# Patient Record
Sex: Male | Born: 1995 | Race: White | Hispanic: No | Marital: Single | Smoking: Never smoker
Health system: Southern US, Community
[De-identification: ages and names within clinical notes are randomized; demographics above are authoritative.]

## PROBLEM LIST (undated history)

## (undated) DIAGNOSIS — F909 Attention-deficit hyperactivity disorder, unspecified type: Secondary | ICD-10-CM

## (undated) HISTORY — DX: Attention-deficit hyperactivity disorder, unspecified type: F90.9

---

## 2018-01-28 ENCOUNTER — Observation Stay (HOSPITAL_COMMUNITY): Payer: BLUE CROSS/BLUE SHIELD

## 2018-01-28 ENCOUNTER — Encounter (HOSPITAL_COMMUNITY): Payer: Self-pay | Admitting: Internal Medicine

## 2018-01-28 ENCOUNTER — Observation Stay (HOSPITAL_COMMUNITY)
Admission: EM | Admit: 2018-01-28 | Discharge: 2018-01-29 | Disposition: A | Discharge status: Home / Self Care | Payer: BLUE CROSS/BLUE SHIELD | Attending: Emergency Medicine | Admitting: Emergency Medicine

## 2018-01-28 ENCOUNTER — Emergency Department (HOSPITAL_COMMUNITY): Payer: BLUE CROSS/BLUE SHIELD

## 2018-01-28 DIAGNOSIS — G40909 Epilepsy, unspecified, not intractable, without status epilepticus: Secondary | ICD-10-CM

## 2018-01-28 DIAGNOSIS — J329 Chronic sinusitis, unspecified: Secondary | ICD-10-CM

## 2018-01-28 DIAGNOSIS — J019 Acute sinusitis, unspecified: Secondary | ICD-10-CM | POA: Diagnosis not present

## 2018-01-28 DIAGNOSIS — R569 Unspecified convulsions: Secondary | ICD-10-CM | POA: Diagnosis not present

## 2018-01-28 LAB — COMPREHENSIVE METABOLIC PANEL
ALT: 49 U/L — AB (ref 0–44)
AST: 30 U/L (ref 15–41)
Albumin: 4.5 g/dL (ref 3.5–5.0)
Alkaline Phosphatase: 82 U/L (ref 38–126)
Anion gap: 10 (ref 5–15)
BUN: 14 mg/dL (ref 6–20)
CHLORIDE: 103 mmol/L (ref 98–111)
CO2: 24 mmol/L (ref 22–32)
CREATININE: 0.82 mg/dL (ref 0.61–1.24)
Calcium: 9.5 mg/dL (ref 8.9–10.3)
GFR calc non Af Amer: >60 mL/min (ref >60)
Glucose, Bld: 93 mg/dL (ref 70–99)
Potassium: 4.1 mmol/L (ref 3.5–5.1)
Sodium: 137 mmol/L (ref 135–145)
Total Bilirubin: 1.1 mg/dL (ref 0.3–1.2)
Total Protein: 6.9 g/dL (ref 6.5–8.1)

## 2018-01-28 LAB — RAPID URINE DRUG SCREEN, HOSP PERFORMED
Amphetamines: NOT DETECTED
Barbiturates: NOT DETECTED
Benzodiazepines: NOT DETECTED
Cocaine: NOT DETECTED
Opiates: NOT DETECTED
Tetrahydrocannabinol: NOT DETECTED

## 2018-01-28 LAB — CBC WITH DIFFERENTIAL/PLATELET
Abs Immature Granulocytes: 0.03 K/uL (ref 0.00–0.07)
Basophils Absolute: 0 K/uL (ref 0.0–0.1)
Basophils Relative: 0 %
Eosinophils Absolute: 0.1 K/uL (ref 0.0–0.5)
Eosinophils Relative: 1 %
HCT: 45.8 % (ref 39.0–52.0)
Hemoglobin: 15.1 g/dL (ref 13.0–17.0)
Immature Granulocytes: 0 %
Lymphocytes Relative: 16 %
Lymphs Abs: 1.5 K/uL (ref 0.7–4.0)
MCH: 29.3 pg (ref 26.0–34.0)
MCHC: 33 g/dL (ref 30.0–36.0)
MCV: 88.8 fL (ref 80.0–100.0)
Monocytes Absolute: 0.7 K/uL (ref 0.1–1.0)
Monocytes Relative: 8 %
Neutro Abs: 7 K/uL (ref 1.7–7.7)
Neutrophils Relative %: 75 %
Platelets: 287 K/uL (ref 150–400)
RBC: 5.16 MIL/uL (ref 4.22–5.81)
RDW: 11.7 % (ref 11.5–15.5)
WBC: 9.4 K/uL (ref 4.0–10.5)
nRBC: 0 % (ref 0.0–0.2)

## 2018-01-28 LAB — ETHANOL: Alcohol, Ethyl (B): <10 mg/dL

## 2018-01-28 LAB — URINALYSIS, ROUTINE W REFLEX MICROSCOPIC
Bilirubin Urine: NEGATIVE
Glucose, UA: NEGATIVE mg/dL
Hgb urine dipstick: NEGATIVE
Ketones, ur: NEGATIVE mg/dL
Leukocytes, UA: NEGATIVE
NITRITE: NEGATIVE
Protein, ur: NEGATIVE mg/dL
Specific Gravity, Urine: 1.015 (ref 1.005–1.030)
pH: 7 (ref 5.0–8.0)

## 2018-01-28 LAB — MAGNESIUM: Magnesium: 2.4 mg/dL (ref 1.7–2.4)

## 2018-01-28 LAB — TROPONIN I: Troponin I: <0.03 ng/mL (ref <0.03)

## 2018-01-28 MED ORDER — LEVETIRACETAM 500 MG PO TABS: 500.0000 mg | ORAL_TABLET | Freq: Two times a day (BID) | ORAL | 0 refills | Status: DC

## 2018-01-28 MED ORDER — LORAZEPAM 2 MG/ML IJ SOLN
2.0000 mg | Freq: Once | INTRAMUSCULAR | Status: AC
  Administered 2018-01-28: 1 mg via INTRAVENOUS
  Filled 2018-01-28: qty 1

## 2018-01-28 MED ORDER — SODIUM CHLORIDE 0.9 % IV SOLN
INTRAVENOUS | Status: AC
  Administered 2018-01-28 – 2018-01-29 (×2): via INTRAVENOUS

## 2018-01-28 MED ORDER — LORAZEPAM 2 MG/ML IJ SOLN: 2.0000 mg | INTRAMUSCULAR | Status: DC | PRN

## 2018-01-28 MED ORDER — HALOPERIDOL LACTATE 5 MG/ML IJ SOLN: 5.0000 mg | Freq: Once | INTRAMUSCULAR | Status: DC

## 2018-01-28 MED ORDER — LORAZEPAM 2 MG/ML IJ SOLN
0.5000 mg | Freq: Once | INTRAMUSCULAR | Status: AC
  Administered 2018-01-28: 0.5 mg via INTRAVENOUS

## 2018-01-28 MED ORDER — LEVETIRACETAM IN NACL 1000 MG/100ML IV SOLN
1000.0000 mg | Freq: Once | INTRAVENOUS | Status: AC
  Administered 2018-01-28: 1000 mg via INTRAVENOUS
  Filled 2018-01-28: qty 100

## 2018-01-28 MED ORDER — LEVETIRACETAM IN NACL 1000 MG/100ML IV SOLN
1000.0000 mg | Freq: Two times a day (BID) | INTRAVENOUS | Status: DC
  Administered 2018-01-29 (×2): 1000 mg via INTRAVENOUS
  Filled 2018-01-28 (×2): qty 100

## 2018-01-28 MED ORDER — LORAZEPAM 2 MG/ML IJ SOLN: 4.0000 mg | INTRAMUSCULAR | Status: AC

## 2018-01-28 MED ORDER — ENOXAPARIN SODIUM 40 MG/0.4ML ~~LOC~~ SOLN
40.0000 mg | Freq: Every day | SUBCUTANEOUS | Status: DC
  Administered 2018-01-28: 40 mg via SUBCUTANEOUS
  Filled 2018-01-28: qty 0.4

## 2018-01-28 MED ORDER — ACETAMINOPHEN 325 MG PO TABS
650.0000 mg | ORAL_TABLET | Freq: Four times a day (QID) | ORAL | Status: DC | PRN
  Administered 2018-01-28: 650 mg via ORAL
  Filled 2018-01-28: qty 2

## 2018-01-28 NOTE — ED Provider Notes (Signed)
Medical screening examination/treatment/procedure(s) were conducted as a shared visit with non-physician practitioner(s) and myself.  I personally evaluated the patient during the encounter. Briefly, the patient is a 23 y.o. male who presents with seizures.  Patient is a Lexicographer.  Supposedly had seizure about 8 months ago and had an EEG with neurology that was unremarkable, he is not on any antiseizure medications.  Patient had seizure-like activity today.  Has had post ictal phase upon arrival.  Patient had about 1 minute of a grand mal seizure that was tonic-clonic that was witnessed by roommates.  He did bite his tongue.  Patient denies any recent drug or alcohol use.  Patient neurologically intact.  Patient discussed with neurology and given likely second seizure will start him on Keppra.  We will have him follow-up outpatient.  However, prior to patient being discharged he had a second grand mal seizure lasting about 1 minute.  He was given IV 1 mg of Ativan.  At this time will admit for long-term monitoring, MRI.  Will give IV Keppra load.   Thus far lab work is unremarkable.  No fever, no concern for meningitis.  Patient did return to his baseline before second seizure.  No signs to suggest infection.  Electrolytes within normal limits.  No urinary tract infection.  Urine drug screen unremarkable.  Alcohol level negative.  Patient to be admitted to medicine service for further care.Hemodynamically improved after seizure-like activity.   This chart was dictated using voice recognition software.  Despite best efforts to proofread,  errors can occur which can change the documentation meaning.  .Critical Care Performed by: Virgina Norfolk, DO Authorized by: Virgina Norfolk, DO   Critical care provider statement:    Critical care time (minutes):  35   Critical care time was exclusive of:  Separately billable procedures and treating other patients and teaching time   Critical care was  necessary to treat or prevent imminent or life-threatening deterioration of the following conditions:  CNS failure or compromise   Critical care was time spent personally by me on the following activities:  Development of treatment plan with patient or surrogate, evaluation of patient's response to treatment, ordering and review of laboratory studies, ordering and performing treatments and interventions, ordering and review of radiographic studies, pulse oximetry and re-evaluation of patient's condition     EKG Interpretation None           Virgina Norfolk, DO 01/28/18 1920

## 2018-01-28 NOTE — ED Notes (Signed)
Pt continued to be agitated after initial 1mg  or ativan. VO to give .5mg .  Wasted remainder .5mg  .12ml of ativan with Christeen Douglas, RN.

## 2018-01-28 NOTE — ED Notes (Signed)
Patient transported to X-ray 

## 2018-01-28 NOTE — H&P (Signed)
History and Physical    Mondrell Anglade IFB:[id number] DOB: 09-09-1995 DOA: 01/28/2018  PCP: System, Pcp Not In Patient coming from: Home  Chief Complaint: Seizure  HPI: Georgie Shambach is a 23 y.o. male with no significant past medical history presenting to the ED via EMS after he was noted to have a tonic-clonic seizure by her roommates at home.  Patient postictal with tongue injury present upon arrival to the ED.  ED provider discussed the case with neurology and plan was to discharge the patient home on Keppra.  However, before being discharged patient had a second grand mal seizure lasting about 1 minute in the ED.  He was given IV 1 mg Ativan.  IV Keppra load given.  Patient is currently postictal and very somnolent.  No history could be obtained from him.  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  He had a seizure about 8 months ago and had an EEG with neurology in a different state that was unremarkable.  He is not on any home antiepileptics.  Review of Systems: As per HPI otherwise 10 point review of systems negative.  History reviewed. No pertinent past medical history.   History reviewed. No pertinent surgical history.   has no history on file for tobacco, alcohol, and drug.  No Known Allergies  History reviewed. No pertinent family history.  Prior to Admission medications   Medication Sig Start Date End Date Taking? Authorizing Provider  levETIRAcetam (KEPPRA) 500 MG tablet Take 1 tablet (500 mg total) by mouth 2 (two) times daily for 15 days. 01/28/18 02/12/18  Claude Manges, PA-C    Physical Exam: Vitals:   01/28/18 1715 01/28/18 1730 01/28/18 1745 01/28/18 1830  BP: 108/78 91/63 (!) 145/83 100/62  Pulse: 72 76 78 93  Resp: 19 12 18 15   Temp:      TempSrc:      SpO2: 100% 100% 100% 97%    Physical Exam  Constitutional: He appears well-developed and well-nourished. No distress.  HENT:  Head: Normocephalic.  Tongue bite marks  present  Eyes:  Unable to assess due to lack of patient cooperation  Neck: Neck supple.  Cardiovascular: Normal rate, regular rhythm and intact distal pulses.  Pulmonary/Chest: Effort normal and breath sounds normal. No respiratory distress. He has no wheezes. He has no rales.  Abdominal: Soft. Bowel sounds are normal. He exhibits no distension. There is no abdominal tenderness.  Musculoskeletal:        General: No edema.  Neurological:  Postictal, very somnolent Following commands only intermittently  Skin: Skin is warm and dry. He is not diaphoretic.     Labs on Admission: I have personally reviewed following labs and imaging studies  CBC: Recent Labs  Lab 01/28/18 1419  WBC 9.4  NEUTROABS 7.0  HGB 15.1  HCT 45.8  MCV 88.8  PLT 287   Basic Metabolic Panel: Recent Labs  Lab 01/28/18 1419 01/28/18 1711  NA 137  --   K 4.1  --   CL 103  --   CO2 24  --   GLUCOSE 93  --   BUN 14  --   CREATININE 0.82  --   CALCIUM 9.5  --   MG  --  2.4   GFR: CrCl cannot be calculated (Unknown ideal weight.). Liver Function Tests: Recent Labs  Lab 01/28/18 1419  AST 30  ALT 49*  ALKPHOS 82  BILITOT 1.1  PROT 6.9  ALBUMIN 4.5  No results for input(s): LIPASE, AMYLASE in the last 168 hours. No results for input(s): AMMONIA in the last 168 hours. Coagulation Profile: No results for input(s): INR, PROTIME in the last 168 hours. Cardiac Enzymes: Recent Labs  Lab 01/28/18 1419  TROPONINI <0.03   BNP (last 3 results) No results for input(s): PROBNP in the last 8760 hours. HbA1C: No results for input(s): HGBA1C in the last 72 hours. CBG: No results for input(s): GLUCAP in the last 168 hours. Lipid Profile: No results for input(s): CHOL, HDL, LDLCALC, TRIG, CHOLHDL, LDLDIRECT in the last 72 hours. Thyroid Function Tests: No results for input(s): TSH, T4TOTAL, FREET4, T3FREE, THYROIDAB in the last 72 hours. Anemia Panel: No results for input(s): VITAMINB12, FOLATE,  FERRITIN, TIBC, IRON, RETICCTPCT in the last 72 hours. Urine analysis:    Component Value Date/Time   COLORURINE YELLOW 01/28/2018 1620   APPEARANCEUR CLEAR 01/28/2018 1620   LABSPEC 1.015 01/28/2018 1620   PHURINE 7.0 01/28/2018 1620   GLUCOSEU NEGATIVE 01/28/2018 1620   HGBUR NEGATIVE 01/28/2018 1620   BILIRUBINUR NEGATIVE 01/28/2018 1620   KETONESUR NEGATIVE 01/28/2018 1620   PROTEINUR NEGATIVE 01/28/2018 1620   NITRITE NEGATIVE 01/28/2018 1620   LEUKOCYTESUR NEGATIVE 01/28/2018 1620    Radiological Exams on Admission: Ct Head Wo Contrast  Result Date: 01/28/2018 CLINICAL DATA:  Seizure today. EXAM: CT HEAD WITHOUT CONTRAST TECHNIQUE: Contiguous axial images were obtained from the base of the skull through the vertex without intravenous contrast. COMPARISON:  None. FINDINGS: Brain: No evidence of acute infarction, hemorrhage, hydrocephalus, extra-axial collection or mass lesion/mass effect. Vascular: No hyperdense vessel or unexpected calcification. Skull: Intact.  No focal lesion. Sinuses/Orbits: The left frontal sinus is completely opacified. Scattered ethmoid air cell disease is seen. Mucous retention cysts or polyps are noted in the maxillary sinuses, greater on the left. Orbits appear normal. Other: None. IMPRESSION: No acute abnormality. Complete opacification of the left frontal sinus with scattered ethmoid air cell disease and mucous retention cysts or polyps in the maxillary sinuses, greater on the left. Electronically Signed   By: Drusilla Kannerhomas  Dalessio M.D.   On: 01/28/2018 15:30    EKG: Independently reviewed.  Sinus rhythm (heart rate 60).  Assessment/Plan Principal Problem:   Seizures (HCC) Active Problems:   Sinusitis   Seizures Patient had a witnessed tonic-clonic seizure at home and another episode in the ED.  Received Ativan in the ED. Unable to obtain any history from the patient at this time as he is postictal and very somnolent. Per ED provider conversation with the  patient, he had seizure activity about 8 months ago and was seen by neurology and [place]   Infectious etiology less likely to be a precipitating factor as patient is afebrile and does not have leukocytosis.  UA not suggestive of infection.  Magnesium level normal.  Blood ethanol level negative.  UDS negative. Head CT negative for acute abnormality.  Patient was seen by Dr. Otelia LimesLindzen from neurology. -Monitor on telemetry -Received IV Keppra load in the ED.  Continue IV Keppra 1000 mg every 12 hours. -Ativan PRN -EEG -MRI brain pending -Seizure precautions -Keep n.p.o.; IV fluid hydration -Neurology following; appreciate recs  Sinusitis Head CT showing complete opacification of the left frontal sinus with scattered ethmoid air cell disease and mucous retention cysts or polyps in the maxillary sinuses, greater on the left.  Unable to obtain any history from the patient at this time.  He is afebrile and does not have  leukocytosis.  Possibly chronic sinusitis. -Please discuss with the patient in the morning.  DVT prophylaxis: Lovenox Code Status: Full code Family Communication: No family available Disposition Plan: Anticipate discharge in 1 to 2 days. Consults called: Neurology Admission status: Observation, telemetry  John GiovanniVasundhra Chasitee Zenker MD [facility] Pager [phone]  If 7PM-7AM, please contact night-coverage [email or web address] Password [email or web address]  01/28/2018, 7:44 PM

## 2018-01-28 NOTE — ED Notes (Signed)
EMT applied sz pads to bed for pt safety.

## 2018-01-28 NOTE — Discharge Instructions (Addendum)
I have prescribed medication, please take 1 tablet twice a day to prevent seizures. You are really not allowed to drive until you are seizure-free for 6 months.  You also need to see a neurologist, please schedule an appointment with [facility] for an EEG along with an MRI brain.  I have provided this referral on your discharge papers.

## 2018-01-28 NOTE — ED Notes (Signed)
Pt was taken from xray to MRI. Called [facility] and left msg for accepting nurse who took report earlier. Pt will go up when they return from MRI.

## 2018-01-28 NOTE — ED Notes (Signed)
Patient returned from MRI.

## 2018-01-28 NOTE — Consult Note (Signed)
NEURO HOSPITALIST CONSULT NOTE   Requestig physician: Dr. Loney Lohathore  Reason for Consult: Recurrence of seizure  History obtained from:  Chart    HPI:                                                                                                                                          Michael Haley is an 23 y.o. male who presented to the ED this afternoon after having a GTC seizure lasting 1 minute this morning. The seizure was witnessed by [facility]mates [facility]. The patient was postictal with tongue injury present upon arrival to ED. He did not have urine or bowel incontinence. He was alert and oriented x 4 on arrival to the ED. He denied recent EtOH and drug use.  The patient [facility]. He was not placed on an anticonvulsant at that time and did not have a Neurology follow up.   After ED assessment, he was about to be loaded with Keppra when he had a second seizure. Decision was made to admit for further monitoring, EEG and MRI.   He is unable to provide ROS to Neurologist due to postictal state. Per initial EDP note he had been feeling very fatigued recently. He denied CP, sick contacts or headache.   History reviewed. No pertinent past medical history.  History reviewed. No pertinent surgical history.  History reviewed. No pertinent family history.            Social History:  has no history on file for tobacco, alcohol, and drug.  No Known Allergies  HOME MEDICATIONS:                                                                                                                     None   ROS:  Unable to obtain due to postictal state, except for complaint of back pain.    Blood pressure 107/77, pulse 80, temperature 98.2 F (36.8 C), temperature source  Oral, resp. rate 15, SpO2 95 %.   General Examination:                                                                                                       Physical Exam  HEENT-  Normocephalic. Evidence for bite injury to anterior tongue.     Lungs- Respirations unlabored.  Extremities- Warm and well perfused Musculoskeletal- No TTP to spinous processes or paraspinal muscles   Neurological Examination Mental Status: Postical somnolence. Oriented to hospital. Responds to name. Follows motor commands intermittently. No neglect.  Cranial Nerves: II: Not cooperative to visual field testing. PERRL.   III,IV, VI: Eyelids appear symmetric. Eyes conjugate when eyelids are opened transiently.  V,VII: Face symmetric. Unable to participate in sensory testing.  VIII: hearing intact to voice IX,X: No hypophonia XI: No asymmetry XII: midline tongue extension Motor: Moves all 4 extremities equally to command without asymmetry. Maximum resistance in context of postictal state is 4/5.  Sensory: Moves extremities to tactile stimulation.   Deep Tendon Reflexes: 2+ throughout except for 3+ patellae.  Plantars: Right: downgoing   Left: downgoing Cerebellar: Performs FNF with no gross ataxia bilaterally Gait: Unable to assess   Lab Results: Basic Metabolic Panel: Recent Labs  Lab 01/28/18 1419 01/28/18 1711  NA 137  --   K 4.1  --   CL 103  --   CO2 24  --   GLUCOSE 93  --   BUN 14  --   CREATININE 0.82  --   CALCIUM 9.5  --   MG  --  2.4    CBC: Recent Labs  Lab 01/28/18 1419  WBC 9.4  NEUTROABS 7.0  HGB 15.1  HCT 45.8  MCV 88.8  PLT 287    Cardiac Enzymes: Recent Labs  Lab 01/28/18 1419  TROPONINI <0.03    Lipid Panel: No results for input(s): CHOL, TRIG, HDL, CHOLHDL, VLDL, LDLCALC in the last 168 hours.  Imaging: Ct Head Wo Contrast  Result Date: 01/28/2018 CLINICAL DATA:  Seizure today. EXAM: CT HEAD WITHOUT CONTRAST TECHNIQUE: Contiguous axial images were  obtained from the base of the skull through the vertex without intravenous contrast. COMPARISON:  None. FINDINGS: Brain: No evidence of acute infarction, hemorrhage, hydrocephalus, extra-axial collection or mass lesion/mass effect. Vascular: No hyperdense vessel or unexpected calcification. Skull: Intact.  No focal lesion. Sinuses/Orbits: The left frontal sinus is completely opacified. Scattered ethmoid air cell disease is seen. Mucous retention cysts or polyps are noted in the maxillary sinuses, greater on the left. Orbits appear normal. Other: None. IMPRESSION: No acute abnormality. Complete opacification of the left frontal sinus with scattered ethmoid air cell disease and mucous retention cysts or polyps in the maxillary sinuses, greater on the left. Electronically Signed   By: Drusilla Kanner M.D.   On: 01/28/2018 15:30    Assessment: 23 year old male presenting with second and third seizures  of his lifetime 1. Postictal on exam with no lateralizing features 2. CT head shows no acute abnormality   Recommendations: 1. Loaded with Keppra 1000 mg IV. Continue with 500 mg IV or PO BID 2. EEG 3. MRI brain with contrast.     Electronically signed: Dr. Caryl Pina 01/28/2018, 9:04 PM

## 2018-01-28 NOTE — ED Triage Notes (Signed)
Pt in via [facility], per EMS pt had witnessed 1 minute seizure grand mal tonic clonic by roommates, pt postictal with tongue injury present upon arrival to ED, pt did not have urine or bowel incontinence, pt A&O x4, denies recent ETOH and Drug use, recent flight from [place] last  Night

## 2018-01-28 NOTE — ED Notes (Signed)
MRI called to inquire about pt. Pt was resting and not currently a good historian about his prior med hx. MRI tech suggested a cxr to confirm there is nothing mechanical that would prevent him having an MRI safely. EDP Mardene Celeste informed and she will order a cxr.

## 2018-01-28 NOTE — ED Provider Notes (Signed)
[facility] EMERGENCY DEPARTMENT Provider Note   CSN: [id number] Arrival date & time: 01/28/18  1310     History   Chief Complaint Chief Complaint  Patient presents with  . Seizures    HPI Michael Haley is a 23 y.o. male.  23 y.o male with no PMH presents to the ED s/p tonic-clonic seizure at [facility] this morning. Seizure was witnessed by his roommates and lasted approximately 1 minute. Patient reports having a similar episode in May where he was seen in the ED at [facility], he reports not being placed on any seizure medication and has not have follow up with neurology. Patient return from [place] last night on an hour flight, and reports arriving home at 1am this morning. He endorses LOC and feeling very fatigued lately. He denies any chest pain, sick contacts, headache or other complaints. History was very limited due to patient's state.        Home Medications    Prior to Admission medications   Medication Sig Start Date End Date Taking? Authorizing Provider  levETIRAcetam (KEPPRA) 500 MG tablet Take 1 tablet (500 mg total) by mouth 2 (two) times daily for 15 days. 01/28/18 02/12/18  Claude Manges, PA-C    Family History No family history on file.  Social History Social History   Tobacco Use  . Smoking status: Not on file  Substance Use Topics  . Alcohol use: Not on file  . Drug use: Not on file     Allergies   Patient has no known allergies.   Review of Systems Review of Systems  Unable to perform ROS: Acuity of condition  Constitutional: Negative for fever.     Physical Exam Updated Vital Signs BP 100/62   Pulse 93   Temp 98.2 F (36.8 C) (Oral)   Resp 15   SpO2 97%   Physical Exam Vitals signs and nursing note reviewed.  Constitutional:      Appearance: He is well-developed.     Comments: Sleepy and fatigued.   HENT:     Head: Normocephalic and atraumatic.     Mouth/Throat:     Tongue:  Lesions present.     Comments: Abrasion to the left side of his tongue. bleeding Controlled at this time. Eyes:     General: No scleral icterus.    Pupils: Pupils are equal, round, and reactive to light.  Neck:     Musculoskeletal: Normal range of motion.  Cardiovascular:     Heart sounds: Normal heart sounds.  Pulmonary:     Effort: Pulmonary effort is normal.     Breath sounds: Normal breath sounds. No wheezing.  Chest:     Chest wall: No tenderness.  Abdominal:     General: Bowel sounds are normal. There is no distension.     Palpations: Abdomen is soft.     Tenderness: There is no abdominal tenderness.  Musculoskeletal:        General: No tenderness or deformity.  Skin:    General: Skin is warm and dry.  Neurological:     Mental Status: He is alert and oriented to person, place, and time.     Comments: Alert, oriented, thought content appropriate. Speech fluent without evidence of aphasia. Able to follow 2 step commands with some delay in response.  Cranial Nerves:  II:  Peripheral visual fields grossly normal, pupils, round, reactive to light III,IV, VI: ptosis not present, extra-ocular motions intact bilaterally  V,VII: smile symmetric,  facial light touch sensation equal VIII: hearing grossly normal bilaterally  IX,X: midline uvula rise  XI: bilateral shoulder shrug equal and strong XII: midline tongue extension  Motor:  5/5 in upper and lower extremities bilaterally including strong and equal grip strength and dorsiflexion/plantar flexion Sensory: light touch normal in all extremities.  Cerebellar: normal finger-to-nose with bilateral upper extremities, pronator drift negative        ED Treatments / Results  Labs (all labs ordered are listed, but only abnormal results are displayed) Labs Reviewed  COMPREHENSIVE METABOLIC PANEL - Abnormal; Notable for the following components:      Result Value   ALT 49 (*)    All other components within normal limits  CBC  WITH DIFFERENTIAL/PLATELET  TROPONIN I  RAPID URINE DRUG SCREEN, HOSP PERFORMED  URINALYSIS, ROUTINE W REFLEX MICROSCOPIC  ETHANOL  MAGNESIUM    EKG EKG Interpretation  Date/Time:  Monday January 28 2018 13:29:07 EST Ventricular Rate:  60 PR Interval:    QRS Duration: 105 QT Interval:  402 QTC Calculation: 402 R Axis:   90 Text Interpretation:  Sinus rhythm Borderline right axis deviation Confirmed by Virgina NorfolkAdam, [place] [id number]) on 01/28/2018 5:56:18 PM   Radiology Ct Head Wo Contrast  Result Date: 01/28/2018 CLINICAL DATA:  Seizure today. EXAM: CT HEAD WITHOUT CONTRAST TECHNIQUE: Contiguous axial images were obtained from the base of the skull through the vertex without intravenous contrast. COMPARISON:  None. FINDINGS: Brain: No evidence of acute infarction, hemorrhage, hydrocephalus, extra-axial collection or mass lesion/mass effect. Vascular: No hyperdense vessel or unexpected calcification. Skull: Intact.  No focal lesion. Sinuses/Orbits: The left frontal sinus is completely opacified. Scattered ethmoid air cell disease is seen. Mucous retention cysts or polyps are noted in the maxillary sinuses, greater on the left. Orbits appear normal. Other: None. IMPRESSION: No acute abnormality. Complete opacification of the left frontal sinus with scattered ethmoid air cell disease and mucous retention cysts or polyps in the maxillary sinuses, greater on the left. Electronically Signed   By: Drusilla Kannerhomas  Dalessio M.D.   On: 01/28/2018 15:30    Procedures Procedures (including critical care time)  Medications Ordered in ED Medications  haloperidol lactate (HALDOL) injection 5 mg (has no administration in time range)  levETIRAcetam (KEPPRA) IVPB 1000 mg/100 mL premix (0 mg Intravenous Stopped 01/28/18 1824)  LORazepam (ATIVAN) injection 2 mg (1 mg Intravenous Given 01/28/18 1755)  LORazepam (ATIVAN) injection 0.5 mg (0.5 mg Intravenous Given 01/28/18 1820)     Initial Impression / Assessment and  Plan / ED Course  I have reviewed the triage vital signs and the nursing notes.  Pertinent labs & imaging results that were available during my care of the patient were reviewed by me and considered in my medical decision making (see chart for details).    Patient with no previous notes history of seizures, with a similar episode in May while visiting family was [facility].  He reports not being seen by a neurologist and has not been placed on any medication for his seizures.  CBC showed no leukocytosis, hemoglobin stable.  CMP showed no electrolyte abnormality, slight elevation on ALT.  Troponin was negative, ethanol level was less than 10. CT Head w/o contrast showed no acute abnormality. ------------------------------------------------------------------- 4:16 PM Patient reasessed by me, he reports having testing and evaluation done by neurology in [place] in October. He reports seen a neurologist but did not have any medication prescribed.  -------------------------------------------------------------------------------- 4:53 PM Spoke to Mother reports patient had an  [facility] in the month of October, he was told by the neurologist that he did not need to be placed on medication at this time as he only had one seizure.  They were advised to follow-up as needed.  Reports she is very concerned as patient currently lives here and attends [employer] here.  Call will be placed to neurology for their recommendations.  5:09 PM Spoke to Dr. Otelia LimesLindzen who is for patient to receive 1000 mg Keppra loading dose, he also needs to be sent home on Keppra medications 500 mg twice daily.  Patient needs to follow-up with neurology for a repeat EEG along with an MRI.  So advised to check on electrolytes such as calcium, magnesium and sodium.  Patient is not to drive for the next 6 months until he is seizure-free, he is also advised not to perform any  activities which would cause him harm or others harm such as swimming alone, operating heavy machinery alone, others.  5:44 PM I have spoken to mother [place] at [phone], she reports she will be taking her son to the neurologist in the upcoming week as she will be driving down from [place].  We will go home on Keppra medication is advised to follow-up with neurology at this time.  He has been resting comfortably in bed eating with a steady gait, stable for discharge at this time.  5:50 PM Patient had another episode of seizure witnessed by medical staff and myself.  At this time will place call for Dr. Otelia LimesLindzen for his recommendations and further admission. He has not received IV Keppra at this time episode lasted about 1 minute, seizure stopped will give ativan.   5:54 PM Spoke to Dr. Otelia LimesLindzen who will see patient in the ED at this time.  Will placed call to hospitalist for admission.   6:58 PM was evaluated by Dr. Otelia LimesLindzen, will order MRI Brain and admit to hospitalist service.  7:19 PM Spoke to hospitalist who will admit patient to the hospitalist service.   Final Clinical Impressions(s) / ED Diagnoses   Final diagnoses:  Seizure disorder [facility](HCC)    ED Discharge Orders         Ordered    levETIRAcetam (KEPPRA) 500 MG tablet  2 times daily     01/28/18 1743           Claude MangesSoto, Shynia Daleo, PA-C 01/28/18 1919    Loren RacerYelverton, David, MD 01/29/18 [id number]

## 2018-01-29 ENCOUNTER — Telehealth: Payer: Self-pay | Admitting: Neurology

## 2018-01-29 ENCOUNTER — Observation Stay (HOSPITAL_COMMUNITY): Payer: BLUE CROSS/BLUE SHIELD

## 2018-01-29 DIAGNOSIS — J329 Chronic sinusitis, unspecified: Secondary | ICD-10-CM | POA: Diagnosis not present

## 2018-01-29 DIAGNOSIS — R569 Unspecified convulsions: Secondary | ICD-10-CM | POA: Diagnosis not present

## 2018-01-29 LAB — HIV ANTIBODY (ROUTINE TESTING W REFLEX): HIV Screen 4th Generation wRfx: NONREACTIVE

## 2018-01-29 MED ORDER — LEVETIRACETAM 500 MG PO TABS: 500.0000 mg | ORAL_TABLET | Freq: Two times a day (BID) | ORAL | 0 refills | Status: DC

## 2018-01-29 MED ORDER — TRAMADOL HCL 50 MG PO TABS
50.0000 mg | ORAL_TABLET | Freq: Once | ORAL | Status: AC
  Administered 2018-01-29: 50 mg via ORAL
  Filled 2018-01-29: qty 1

## 2018-01-29 NOTE — Progress Notes (Signed)
EEG Completed; Results Pending  

## 2018-01-29 NOTE — Discharge Summary (Addendum)
Physician Discharge Summary  Michael Haley WUJ:[id number] DOB: 10/26/1995 DOA: 01/28/2018  PCP: System, Pcp Not In  Admit date: 01/28/2018 Discharge date: 01/29/2018  Admitted From: Home Disposition: Home  Recommendations for Outpatient Follow-up:  1. Follow up with PCP in 1 week 2. Follow up with Neurology as an outpatient 3. Please follow up on the following pending results: None  Home Health: None Equipment/Devices: None  Discharge Condition: Stable CODE STATUS: Full code Diet recommendation: Regular diet   Brief/Interim Summary:  Admission HPI written by [name], MD   Chief Complaint: Seizure  HPI: Michael Haley is a 23 y.o. male with no significant past medical history presenting to the ED via EMS after he was noted to have a tonic-clonic seizure by her roommates at home.  Patient postictal with tongue injury present upon arrival to the ED.  ED provider discussed the case with neurology and plan was to discharge the patient home on Keppra.  However, before being discharged patient had a second grand mal seizure lasting about 1 minute in the ED.  He was given IV 1 mg Ativan.  IV Keppra load given.  Patient is currently postictal and very somnolent.  No history could be obtained from him.  Per ED provider's conversation with the patient, he is a [employer].  He had a seizure about 8 months ago and had an EEG with neurology in a different state that was unremarkable.  He is not on any home antiepileptics.  Review of Systems: As per HPI otherwise 10 point review of systems negative.    Hospital course:  Seizures Recurrent. Tonic-clonic. Loaded with Keppra. Neurology consulted. MRI without abnormalities consistent for seizure predisposition. EEG normal. No seizures overnight. Discharged on Keppra 500 mg BID. Will need outpatient neurology follow-up. Recommendation to discontinue Adderall as an outpatient.  Sinusitis Largely asymptomatic. Chronic.  Outpatient follow-up as needed.  Discharge Diagnoses:  Principal Problem:   Seizures [facility](HCC) Active Problems:   Sinusitis    Discharge Instructions  Discharge Instructions    Increase activity slowly   Complete by:  As directed      Allergies as of 01/29/2018   No Known Allergies     Medication List    STOP taking these medications   amphetamine-dextroamphetamine 10 MG tablet Commonly known as:  ADDERALL     TAKE these medications   levETIRAcetam 500 MG tablet Commonly known as:  KEPPRA Take 1 tablet (500 mg total) by mouth 2 (two) times daily for 15 days.      Follow-up Information    Michael Haley. Call in 1 day.   Contact information: [address]     [address] Michael Haley [phone]         No Known Allergies  Consultations:  Neurology   Procedures/Studies: Dg Chest 2 View  Result Date: 01/28/2018 CLINICAL DATA:  Grand mal seizure EXAM: CHEST - 2 VIEW COMPARISON:  None. FINDINGS: Lungs are clear. Heart size and pulmonary vascularity are normal. No adenopathy. No demonstrable pacemaker or metallic foreign body. No pneumothorax or pneumomediastinum. No bone lesions. IMPRESSION: No abnormality noted. Electronically Signed   By: Bretta BangWilliam  Woodruff III M.D.   On: 01/28/2018 21:04   Ct Head Wo Contrast  Result Date: 01/28/2018 CLINICAL DATA:  Seizure today. EXAM: CT HEAD WITHOUT CONTRAST TECHNIQUE: Contiguous axial images were obtained from the base of the skull through the vertex without intravenous contrast. COMPARISON:  None. FINDINGS: Brain: No evidence of acute infarction,  hemorrhage, hydrocephalus, extra-axial collection or mass lesion/mass effect. Vascular: No hyperdense vessel or unexpected calcification. Skull: Intact.  No focal lesion. Sinuses/Orbits: The left frontal sinus is completely opacified. Scattered ethmoid air cell disease is seen. Mucous retention cysts or polyps are noted in the maxillary  sinuses, greater on the left. Orbits appear normal. Other: None. IMPRESSION: No acute abnormality. Complete opacification of the left frontal sinus with scattered ethmoid air cell disease and mucous retention cysts or polyps in the maxillary sinuses, greater on the left. Electronically Signed   By: Drusilla Kanner M.D.   On: 01/28/2018 15:30   Mr Brain Wo Contrast  Result Date: 01/28/2018 CLINICAL DATA:  23 year old male s/p seizure this afternoon. Postictal. Reports prior seizure in May 2019. EXAM: MRI HEAD WITHOUT CONTRAST TECHNIQUE: Multiplanar, multiecho pulse sequences of the brain and surrounding structures were obtained without intravenous contrast. COMPARISON:  Head CT 1522 hours today. FINDINGS: The examination had to be discontinued prior to completion due to patient request. Axial T1 weighted imaging is motion degraded. Axial T2 weighted imaging could not be obtained. Planned thin slice coronal FLAIR imaging also was not obtained. Brain: No restricted diffusion to suggest acute infarction. No midline shift, mass effect, ventriculomegaly, extra-axial collection or acute intracranial hemorrhage. Cervicomedullary junction and pituitary are within normal limits. Thin slice coronal T2 weighted images demonstrate symmetric and normal appearance of the hippocampal formations. On T2 and FLAIR imaging the temporal lobes appear symmetric and within normal limits. No encephalomalacia or chronic cerebral blood products. No heterotopia identified. Vascular: The major intracranial vascular flow voids visible on thin coronal T2 imaging are preserved. Skull and upper cervical spine: Small area of altered marrow signal in the right parietal bone on series 12, image 19 is visible on the CT today (series 4, image 25 of that exam). This is nonspecific but benign etiology is favored. Other visible marrow signal is within normal limits. Negative visible cervical spine. Sinuses/Orbits: Left frontal sinus and anterior  ethmoid opacification as seen by CT. Superimposed left maxillary alveolar recess mucous retention cysts. No complicating features are evident. Other: Mastoids are clear. Visible internal auditory structures appear normal. IMPRESSION: 1. The examination had to be discontinued prior to completion. No brain abnormality identified. 2. Left frontoethmoidal pattern of obstructive sinus disease. 3. Small nonspecific but probably benign parietal bone lesion. Electronically Signed   By: Odessa Fleming M.D.   On: 01/28/2018 22:25     EEG (01/29/18) Clinical Interpretation: This normal EEG is recorded in the waking and sleep state. There was no seizure or seizure predisposition recorded on this study. Please note that lack of epileptiform activity on EEG does not preclude the possibility of epilepsy.    Subjective: No issues this morning  Discharge Exam: Vitals:   01/29/18 1253 01/29/18 1545  BP: (!) 103/56 (!) 100/51  Pulse: 70 71  Resp: 15 15  Temp: 98.5 F (36.9 C) 98.7 F (37.1 C)  SpO2: 95% 95%   Vitals:   01/29/18 0316 01/29/18 0820 01/29/18 1253 01/29/18 1545  BP: (!) 100/52 (!) 115/59 (!) 103/56 (!) 100/51  Pulse: 73 (!) 58 70 71  Resp: 15 16 15 15   Temp: 97.7 F (36.5 C) 97.6 F (36.4 C) 98.5 F (36.9 C) 98.7 F (37.1 C)  TempSrc: Axillary Axillary Oral Oral  SpO2: 97% 97% 95% 95%    General: Pt is alert, awake, not in acute distress Cardiovascular: RRR, S1/S2 +, no rubs, no gallops Respiratory: CTA bilaterally, no wheezing, no rhonchi Abdominal: Soft,  NT, ND, bowel sounds + Extremities: no edema, no cyanosis    The results of significant diagnostics from this hospitalization (including imaging, microbiology, ancillary and laboratory) are listed below for reference.     Labs: Basic Metabolic Panel: Recent Labs  Lab 01/28/18 1419 01/28/18 1711  NA 137  --   K 4.1  --   CL 103  --   CO2 24  --   GLUCOSE 93  --   BUN 14  --   CREATININE 0.82  --   CALCIUM 9.5  --   MG   --  2.4   Liver Function Tests: Recent Labs  Lab 01/28/18 1419  AST 30  ALT 49*  ALKPHOS 82  BILITOT 1.1  PROT 6.9  ALBUMIN 4.5   CBC: Recent Labs  Lab 01/28/18 1419  WBC 9.4  NEUTROABS 7.0  HGB 15.1  HCT 45.8  MCV 88.8  PLT 287   Cardiac Enzymes: Recent Labs  Lab 01/28/18 1419  TROPONINI <0.03   Urinalysis    Component Value Date/Time   COLORURINE YELLOW 01/28/2018 1620   APPEARANCEUR CLEAR 01/28/2018 1620   LABSPEC 1.015 01/28/2018 1620   PHURINE 7.0 01/28/2018 1620   GLUCOSEU NEGATIVE 01/28/2018 1620   HGBUR NEGATIVE 01/28/2018 1620   BILIRUBINUR NEGATIVE 01/28/2018 1620   KETONESUR NEGATIVE 01/28/2018 1620   PROTEINUR NEGATIVE 01/28/2018 1620   NITRITE NEGATIVE 01/28/2018 1620   LEUKOCYTESUR NEGATIVE 01/28/2018 1620    SIGNED:   Jacquelin Hawking, MD [facility] 01/29/2018, 4:53 PM

## 2018-01-29 NOTE — Progress Notes (Signed)
Pharmacy asked to monitor for AED interactions. Keppra doesn't interact with much and no interaction identify on his profile.   Rx sign off  Ulyses Southward, PharmD, [place], AAHIVP, CPP Infectious Disease Pharmacist 01/29/2018 3:22 PM

## 2018-01-29 NOTE — Progress Notes (Signed)
Pt and Mother given discharge instructions, no new questions or concerns. IV and tele removed. AVS paperwork given, able to verbalize understanding and teach back.   Pt discharged from unit in wheelchair. Mother to transport home

## 2018-01-29 NOTE — Procedures (Signed)
History: 23 yo M being evaluated for recurrent seizures.   Sedation: None  Technique: This is a 21 channel routine scalp EEG performed at the bedside with bipolar and monopolar montages arranged in accordance to the international 10/20 system of electrode placement. One channel was dedicated to EKG recording.    Background: The background consists of intermixed alpha and beta activities. There is a well defined posterior dominant rhythm of 8-9 Hz that attenuates with eye opening. Sleep is recorded with normal appearing structures.   Photic stimulation: Physiologic driving is not performed  EEG Abnormalities: None  Clinical Interpretation: This normal EEG is recorded in the waking and sleep state. There was no seizure or seizure predisposition recorded on this study. Please note that lack of epileptiform activity on EEG does not preclude the possibility of epilepsy.   Ritta Slot, MD [facility] [phone]  If 7pm- 7am, please page neurology on call as listed in AMION.

## 2018-01-29 NOTE — Telephone Encounter (Signed)
Patient's mother called to schedule his appt and was wanting to speak with you. She wanted to update you on what was going on and then ask about her being on video call during appt since she lives in [place]. Please call her at [phone]. Thanks!

## 2018-01-29 NOTE — Progress Notes (Signed)
MRI brain was normal.   EEG was normal.   New information provided by his mother. 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   Given 3rd seizure of lifetime, he should be continued on Keppra despite negative EEG and MRI.   A/R: 1. Discontinuation of Adderall is recommended. This medication can lower the seizure threshold. The patient's mother also reports that the patient experiences agitation at times with this medication, as well as insomnia.  2. Continue Keppra 500 mg BID at discharge.  3. The patient will need to have follow up with outpatient neurology in 1-2 weeks. [place] or [place] [facility] contact numbers have been supplied to his mother. 4. From a neurological standpoint, the patient can be discharged home today if his drowsiness improves.  5. Per [facility] statutes, patients with seizures are not allowed to drive until  they have been seizure-free for six months. Use caution when using heavy equipment or power tools. Avoid working on ladders or at heights. Take showers instead of baths. Ensure the water temperature is not too high on the home water heater. Do not go swimming alone. When caring for infants or small children, sit down when holding, feeding, or changing them to minimize risk of injury to the child in the event you have a seizure. Also, Maintain good sleep hygiene. Avoid alcohol.    Electronically signed: Dr. Caryl Pina

## 2018-01-29 NOTE — Progress Notes (Signed)
Pt more alert now, [place] notified

## 2018-01-29 NOTE — Progress Notes (Signed)
MRI completed and images reviewed. No DWI abnormality, no hippocampal atrophy, no evidence for focal cortical dysplasia at the resolution of the images obtained.   EEG pending for today.   Electronically signed: Dr. Caryl Pina

## 2018-01-29 NOTE — Care Management Note (Signed)
Case Management Note  Patient Details  Name: Michael Haley MRN: [id number] Date of Birth: November 26, 1995  Subjective/Objective:      Pt admitted with seizures. He is currently in school at [employer].  CM spoke to pts mother at the bedside. She lives in [place]. She states his PCP: Dr Hampton Abbot in [place]. No DME. No issues obtaining meds.  Pt would have to Uber to appts per mother.             Action/Plan: CM following for d/c needs, physician orders.   Expected Discharge Date:                  Expected Discharge Plan:  Home/Self Care  In-House Referral:     Discharge planning Services     Post Acute Care Choice:    Choice offered to:     DME Arranged:    DME Agency:     HH Arranged:    HH Agency:     Status of Service:  In process, will continue to follow  If discussed at Long Length of Stay Meetings, dates discussed:    Additional Comments:  Kermit Balo, RN 01/29/2018, 12:00 PM

## 2018-01-31 NOTE — Telephone Encounter (Signed)
Spoke with pt's mother, [place], advising that we will have pt sign DPR prior to his visit to include his mother and that as long as pt calls her from his phone, it is permissible.  [place] appreciative of my call.

## 2018-02-13 ENCOUNTER — Ambulatory Visit (INDEPENDENT_AMBULATORY_CARE_PROVIDER_SITE_OTHER): Payer: BLUE CROSS/BLUE SHIELD | Admitting: Neurology

## 2018-02-13 ENCOUNTER — Encounter: Payer: Self-pay | Admitting: Neurology

## 2018-02-13 ENCOUNTER — Other Ambulatory Visit: Payer: Self-pay

## 2018-02-13 VITALS — BP 90/64 | HR 60 | Ht 74.0 in | Wt 185.0 lb

## 2018-02-13 DIAGNOSIS — G40009 Localization-related (focal) (partial) idiopathic epilepsy and epileptic syndromes with seizures of localized onset, not intractable, without status epilepticus: Secondary | ICD-10-CM

## 2018-02-13 MED ORDER — LEVETIRACETAM 500 MG PO TABS: 500.0000 mg | ORAL_TABLET | Freq: Two times a day (BID) | ORAL | 11 refills | Status: AC

## 2018-02-13 NOTE — Progress Notes (Signed)
NEUROLOGY CONSULTATION NOTE  Michael Haley MRN: [id number] DOB: [birth year]  Referring provider: Dr. Jacquelin Hawking Primary care provider: Dr. Sindy [facility], [place], [phone])  Reason for consult:  seizures  Dear Dr Caleb Popp:  Thank you for your kind referral of Michael Haley for consultation of the above symptoms. Although his history is well known to you, please allow me to reiterate it for the purpose of our medical record. His mother was on speakerphone during the visit to provide additional information. Records and images were personally reviewed where available.  HISTORY OF PRESENT ILLNESS: This is a 23 year old ambidextrous right hand dominant man with a history of ADHD, presenting for recurrent seizures. The first seizure occurred in May 30, 2017 while he was staying with his mother in [place]. He recalls feeling tired and repeatedly stated he did a lot that day. [place] in [place] and felt hot, then woke up on the ground. No tongue bite or incontinence. He recalls his right arm felt weak, no associated pain. He was brought to the ER and was discharged home. He reports he was very stressed and very tired. He was at home with his mother when she witnessed right facial twitching followed by a generalized convulsion lasting 2 minutes. His lips were blue. He was brought to [facility] where he was admitted overnight and had an MRI brain and EEG reported as normal. He was not started on medication at that time. His mother reports another incident in December 2019, it was the last day of the semester and when she went to get him at 10pm, his eyes were open and rolled back. He became very angry with his mother, verbally abusive to her on the phone, stating he was just tired. The most recent incident occurred on 01/28/2018. He is a Consulting civil engineer here in [place] and was witnessed by his roommates to have a GTC with tongue bite. He reports  that it was the first day of class and he was just tired and collapsed. He was brought to [facility] where CBC, CMP, UDS, EtOH level were negative. As he was about to be discharged from the ER, he had a witnessed convulsion lasting 1 minute and was very somnolent after IV Ativan and Keppra. He had a wake and sleep EEG on 01/29/2018 which was normal. He was discharged home on Keppra 500mg  BID. He denies any side effects except for feeling tired. He is noted to be agitated with his mother on the phone, she states that he has a history of anger outbursts, he states it is only with her "because she is annoying." His mother reports a history of ADHD where he would space out but would respond when called. She states by nature he is sleepy/tired, and Adderall was giving him a boost. He had been taking Adderall for at least 5 years and was not overdosing it. He denies any gaps in time, olfactory/gustatory hallucinations, deja vu, rising epigastric sensation, focal numbness/tingling/weakness, myoclonic jerks. He denies any headaches, dizziness, vision changes, neck pain, bowel/bladder dysfunction. He has had back pain since December.  Epilepsy Risk Factors:  He had speech delay and was diagnosed with ADHD. Otherwise he had a normal birth.  There is no history of febrile convulsions, CNS infections such as meningitis/encephalitis, significant traumatic brain injury, neurosurgical procedures, or family history of seizures.  PAST MEDICAL HISTORY: Past Medical History:  Diagnosis Date  . ADHD     PAST  SURGICAL HISTORY: History reviewed. No pertinent surgical history.  MEDICATIONS: Current Outpatient Medications on File Prior to Visit  Medication Sig Dispense Refill  . levETIRAcetam (KEPPRA) 500 MG tablet Take 1 tablet (500 mg total) by mouth 2 (two) times daily. 60 tablet 0   No current facility-administered medications on file prior to visit.     ALLERGIES: No Known Allergies  FAMILY HISTORY: History reviewed.  No pertinent family history.  SOCIAL HISTORY: Social History   Socioeconomic History  . Marital status: Single    Spouse name: Not on file  . Number of children: Not on file  . Years of education: Not on file  . Highest education level: Not on file  Occupational History  . Not on file  Social Needs  . Financial resource strain: Not on file  . Food insecurity:    Worry: Not on file    Inability: Not on file  . Transportation needs:    Medical: Not on file    Non-medical: Not on file  Tobacco Use  . Smoking status: Never Smoker  . Smokeless tobacco: Never Used  Substance and Sexual Activity  . Alcohol use: Never    Frequency: Never  . Drug use: Never  . Sexual activity: Not on file  Lifestyle  . Physical activity:    Days per week: Not on file    Minutes per session: Not on file  . Stress: Not on file  Relationships  . Social connections:    Talks on phone: Not on file    Gets together: Not on file    Attends religious service: Not on file    Active member of club or organization: Not on file    Attends meetings of clubs or organizations: Not on file    Relationship status: Not on file  . Intimate partner violence:    Fear of current or ex partner: Not on file    Emotionally abused: Not on file    Physically abused: Not on file    Forced sexual activity: Not on file  Other Topics Concern  . Not on file  Social History Narrative  . Not on file    REVIEW OF SYSTEMS: Constitutional: No fevers, chills, or sweats, no generalized fatigue, change in appetite Eyes: No visual changes, double vision, eye pain Ear, nose and throat: No hearing loss, ear pain, nasal congestion, sore throat Cardiovascular: No chest pain, palpitations Respiratory:  No shortness of breath at rest or with exertion, wheezes GastrointestinaI: No nausea, vomiting, diarrhea, abdominal pain, fecal incontinence Genitourinary:  No dysuria, urinary retention or frequency Musculoskeletal:  No neck  pain,+ back pain Integumentary: No rash, pruritus, skin lesions Neurological: as above Psychiatric: No depression, insomnia, anxiety Endocrine: No palpitations, fatigue, diaphoresis, mood swings, change in appetite, change in weight, increased thirst Hematologic/Lymphatic:  No anemia, purpura, petechiae. Allergic/Immunologic: no itchy/runny eyes, nasal congestion, recent allergic reactions, rashes  PHYSICAL EXAM: Vitals:   02/13/18 1418  BP: 90/64  Pulse: 60  SpO2: 98%   General: No acute distress Head:  Normocephalic/atraumatic Eyes: Fundoscopic exam shows bilateral sharp discs, no vessel changes, exudates, or hemorrhages Neck: supple, no paraspinal tenderness, full range of motion Back: No paraspinal tenderness Heart: regular rate and rhythm Lungs: Clear to auscultation bilaterally. Vascular: No carotid bruits. Skin/Extremities: No rash, no edema Neurological Exam: Mental status: alert and oriented to person, place, and time, no dysarthria or aphasia, Fund of knowledge is appropriate.  Recent and remote memory are intact.  Attention and concentration  are normal.    Able to name objects and repeat phrases. Cranial nerves: CN I: not tested CN II: pupils equal, round and reactive to light, visual fields intact, fundi unremarkable. CN III, IV, VI:  full range of motion, no nystagmus, no ptosis CN V: facial sensation intact CN VII: upper and lower face symmetric CN VIII: hearing intact to finger rub CN IX, X: gag intact, uvula midline CN XI: sternocleidomastoid and trapezius muscles intact CN XII: tongue midline Bulk & Tone: normal, no fasciculations. Motor: 5/5 throughout with no pronator drift. Sensation: intact to light touch, cold, pin, vibration and joint position sense.  No extinction to double simultaneous stimulation.  Romberg test negative Deep Tendon Reflexes: +2 throughout, no ankle clonus Plantar responses: downgoing bilaterally Cerebellar: no incoordination on  finger to nose, heel to shin. No dysdiadochokinesia Gait: narrow-based and steady, able to tandem walk adequately. Tremor: none  IMPRESSION: This is a 23 year old ambidextrous right hand dominant man with a history of ADHD, presenting with recurrent apparently unprovoked seizures. He had 2 seizures in May 2019, possibly one in December 2019, then most recently 2 in one day last 01/28/2018. MRI brain reportedly normal, records will be requested for review. EEG normal. Etiology of seizures unclear, but at this point we discussed recommendation for seizure medication. He has been taking Keppra 500mg  BID, we discussed mood issues that can occur with this, he is quite verbally abusive to his mother on the phone today, she reports a history of anger outbursts. He would like to continue on Keppra at this time, but we discussed that if he/his friends start noticing more mood issues, would switch to Lamotrigine. We discussed their concerns about Vyvanse intake and seizures, and how recent studies have shown that medications for ADHD do not increase risk for seizures. In addition, he has been taking this same medication for the past 5 years. He may resume Vyvanse intake. We discussed [place] driving laws, and he knows to stop driving after a seizure, until 6 months seizure-free. He will look at his schedule in April/May and let us know when he is available for follow-up before he goes back to [place]. They know to call for any changes.   Thank you for allowing me to participate in the care of this patient. Please do not hesitate to call for any questions or concerns.   Patrcia Dolly, M.D.  CC: Dr. Caleb Popp, Dr.Timothy Arling [facility], [place], [phone])

## 2018-02-13 NOTE — Patient Instructions (Signed)
1. Continue Keppra 500mg  twice a day 2. MRI results from [facility] will be requested for review 3. Let us know when your exams are in April/May and we will look at our schedule for follow-up  Seizure Precautions: 1. If medication has been prescribed for you to prevent seizures, take it exactly as directed.  Do not stop taking the medicine without talking to your doctor first, even if you have not had a seizure in a long time.   2. Avoid activities in which a seizure would cause danger to yourself or to others.  Don't operate dangerous machinery, swim alone, or climb in high or dangerous places, such as on ladders, roofs, or girders.  Do not drive unless your doctor says you may.  3. If you have any warning that you may have a seizure, lay down in a safe place where you can't hurt yourself.    4.  No driving for 6 months from last seizure, as per [facility].   Please refer to the following link on the [employer]'s website for more information: http://www.epilepsyfoundation.org/answerplace/Social/driving/drivingu.cfm   5.  Maintain good sleep hygiene. Avoid alcohol.  6.  Contact your doctor if you have any problems that may be related to the medicine you are taking.  7.  Call 911 and bring the patient back to the ED if:        A.  The seizure lasts longer than 5 minutes.       B.  The patient doesn't awaken shortly after the seizure  C.  The patient has new problems such as difficulty seeing, speaking or moving  D.  The patient was injured during the seizure  E.  The patient has a temperature over 102 F (39C)  F.  The patient vomited and now is having trouble breathing

## 2018-02-18 ENCOUNTER — Encounter: Payer: Self-pay | Admitting: Neurology

## 2018-06-17 ENCOUNTER — Encounter

## 2020-12-04 IMAGING — CT CT HEAD W/O CM
4 series · 17 of 47 positions shown, 19 images · non-contrast
Comparison: None.

CLINICAL DATA: Seizure today.

EXAM:
CT HEAD WITHOUT CONTRAST
TECHNIQUE: Contiguous axial images were obtained from the base of the skull
through the vertex without intravenous contrast.

[Series 3: head bone · axial · 0.47mm/px · z∈[-108,-46]mm · 4 of 91 slices shown]
[im 10/91  bone]
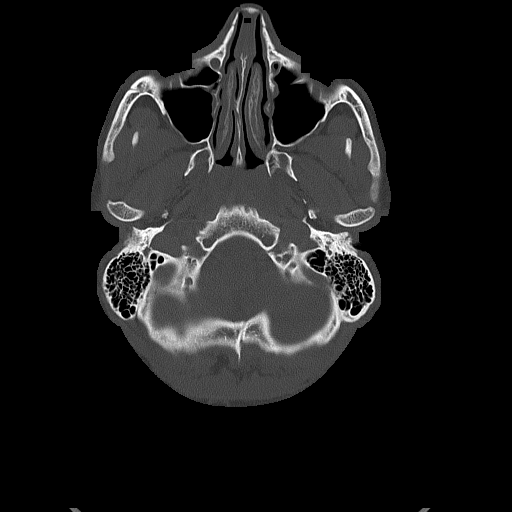
[im 19/91  bone]
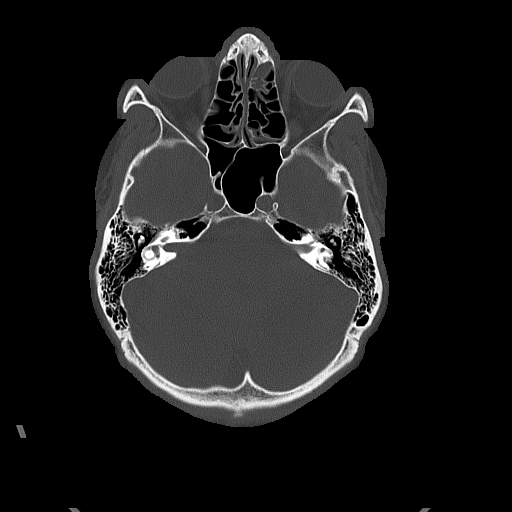
[im 28/91  bone]
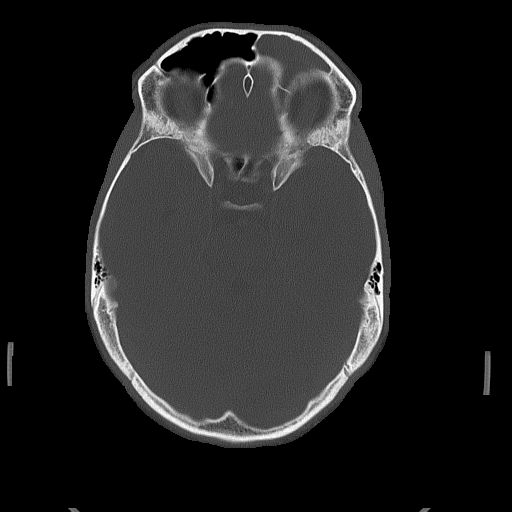
[im 41/91  bone]
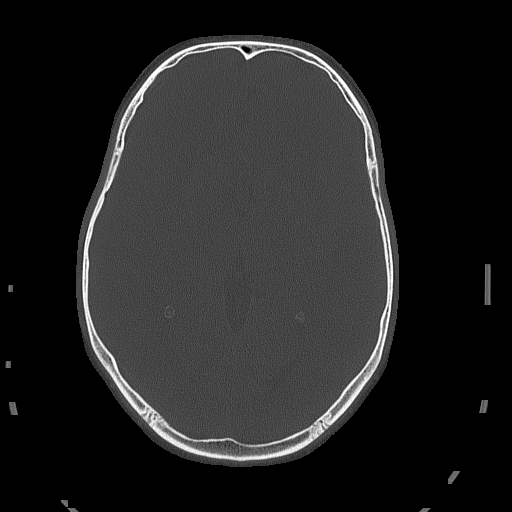

[Series 4: head without · axial · non-contrast · 0.47mm/px · z∈[-106,+29]mm · 7 of 37 slices shown, 9 images]
[im 5/37  brain]
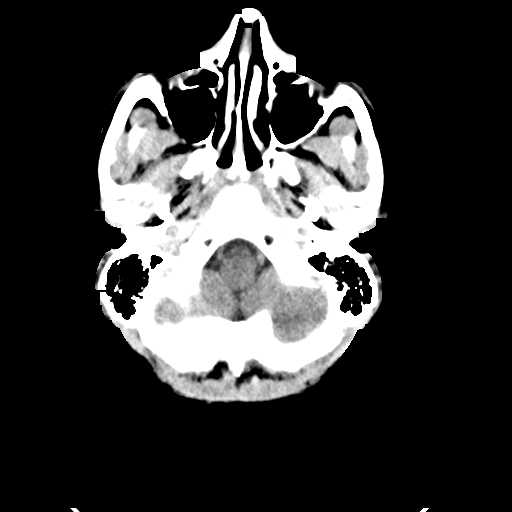
[im 5/37  bone]
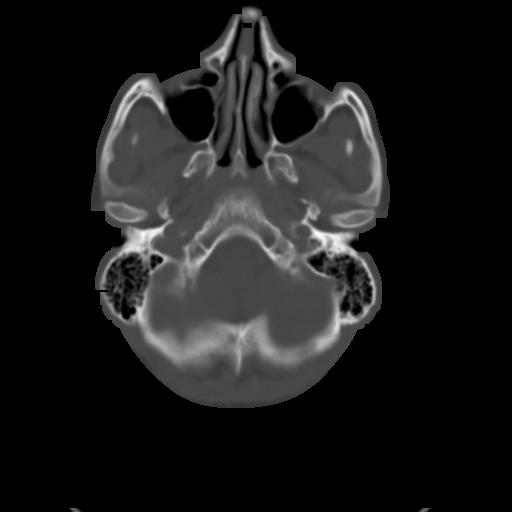
[im 10/37  brain]
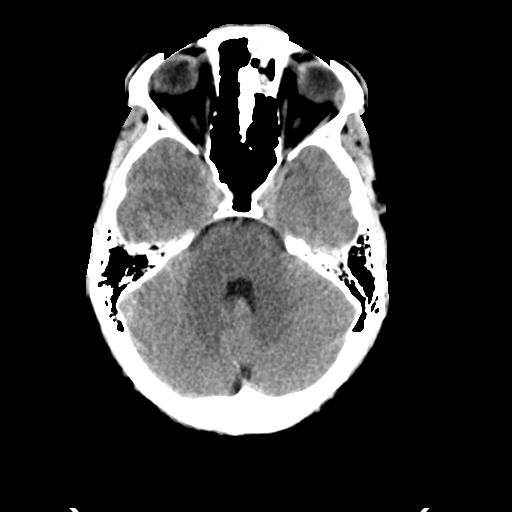
[im 14/37  brain]
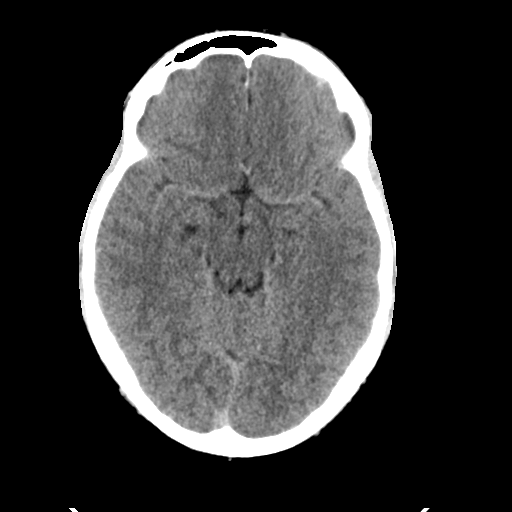
[im 19/37  brain]
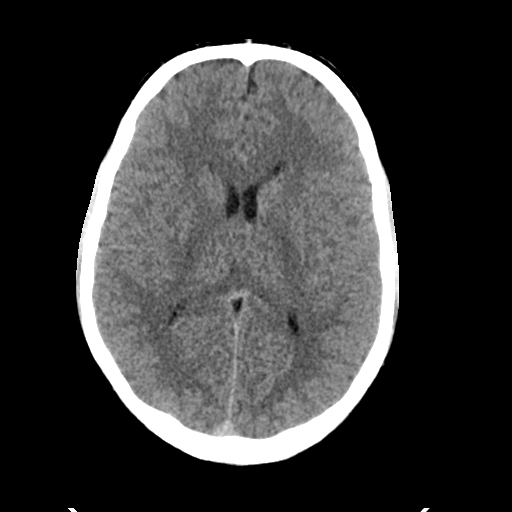
[im 23/37  brain]
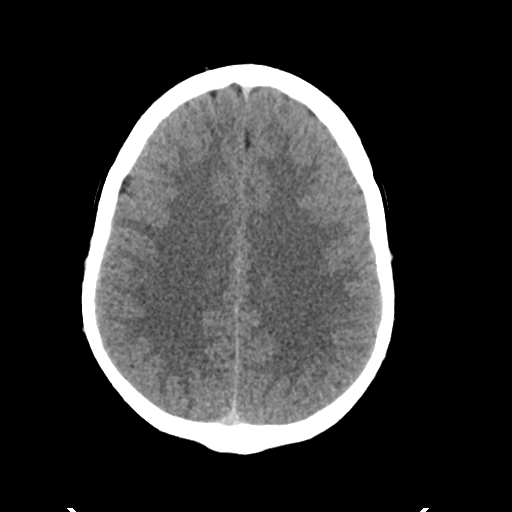
[im 23/37  bone]
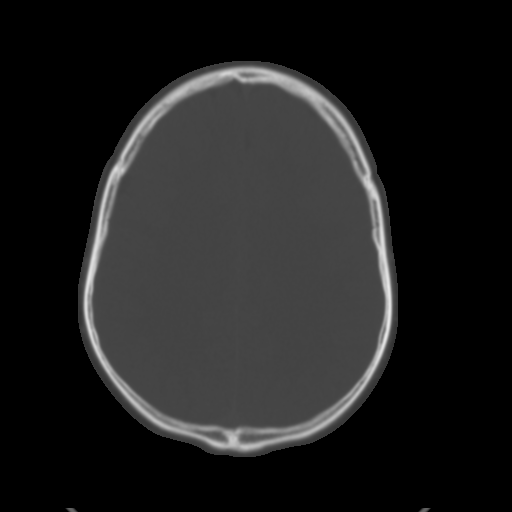
[im 28/37  brain]
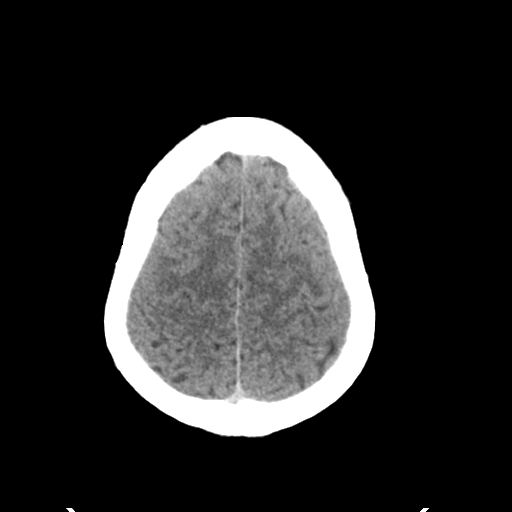
[im 32/37  brain]
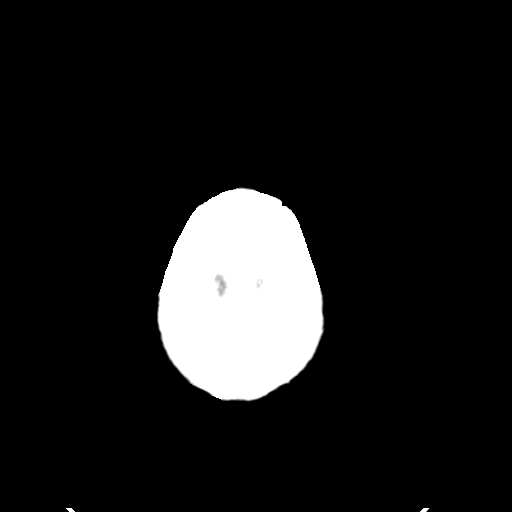

[Series 5: head without cor · coronal · non-contrast · 0.35mm/px · 3 of 77 slices shown]
[im 26/77  brain]
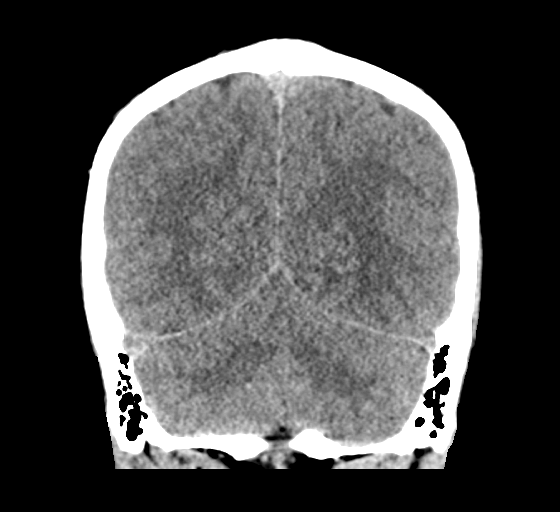
[im 34/77  brain]
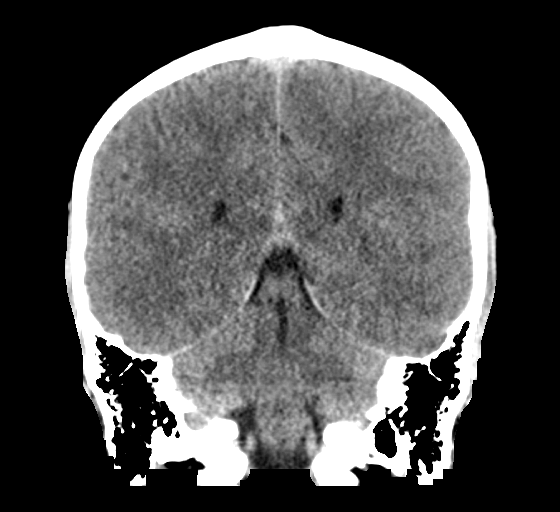
[im 43/77  brain]
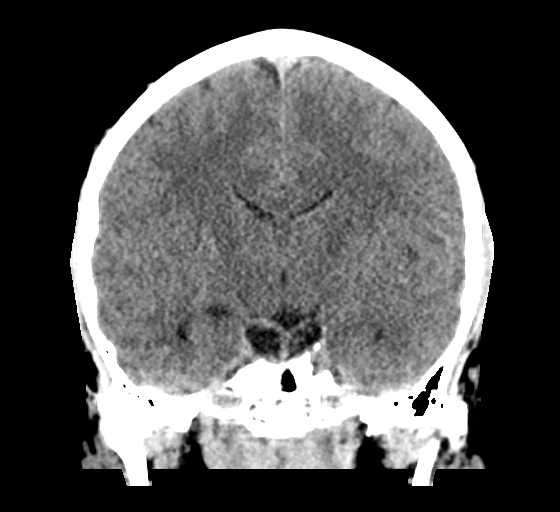

[Series 6: head without sag · sagittal · non-contrast · 0.35mm/px · 3 of 63 slices shown]
[im 21/63  brain]
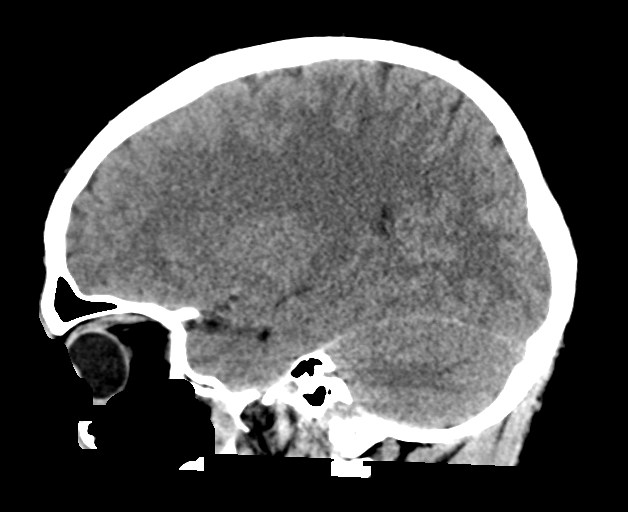
[im 32/63  brain]
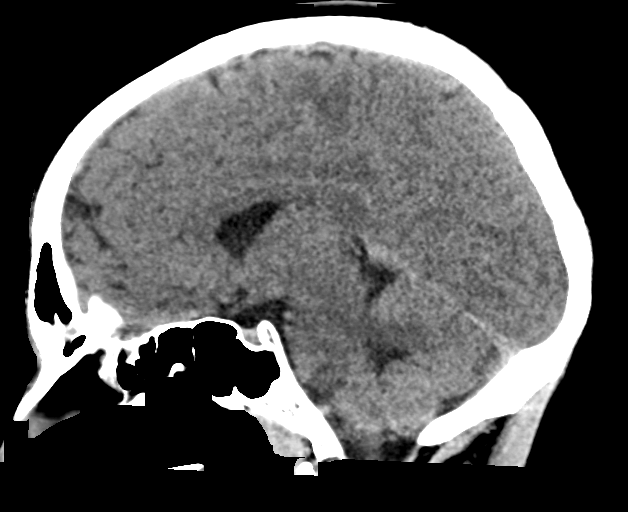
[im 42/63  brain]
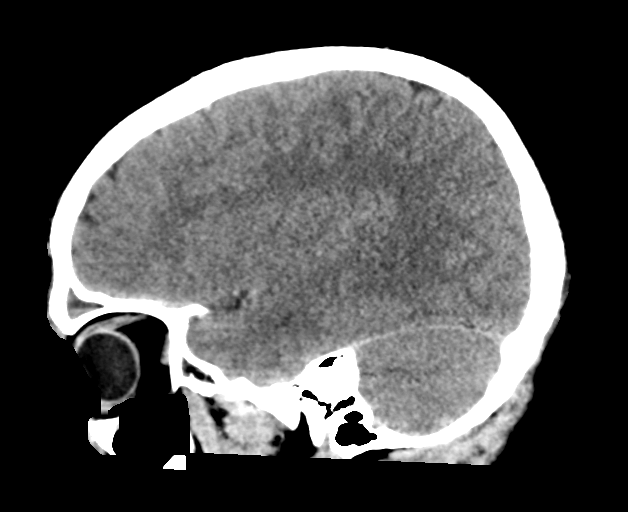

[17 of 47 positions shown; findings below may reference images not displayed]

FINDINGS: Brain: No evidence of acute infarction, hemorrhage, hydrocephalus,
extra-axial collection or mass lesion/mass effect.

Vascular: No hyperdense vessel or unexpected calcification.

Skull: Intact.  No focal lesion.

Sinuses/Orbits: The left frontal sinus is completely opacified.
Scattered ethmoid air cell disease is seen. Mucous retention cysts
or polyps are noted in the maxillary sinuses, greater on the left.
Orbits appear normal.

Other: None.
IMPRESSION: No acute abnormality.

Complete opacification of the left frontal sinus with scattered
ethmoid air cell disease and mucous retention cysts or polyps in the
maxillary sinuses, greater on the left.

## 2021-02-08 ENCOUNTER — Ambulatory Visit: Payer: BC Managed Care – PPO | Admitting: Physical Therapy
